# Patient Record
Sex: Male | Born: 1940 | Race: Black or African American | Hispanic: No | Marital: Single
Health system: Southern US, Community
[De-identification: ages and names within clinical notes are randomized; demographics above are authoritative.]

---

## 2004-09-04 ENCOUNTER — Emergency Department: Payer: Self-pay | Admitting: Emergency Medicine

## 2006-11-29 ENCOUNTER — Encounter: Payer: Self-pay | Admitting: Internal Medicine

## 2006-12-15 ENCOUNTER — Encounter: Payer: Self-pay | Admitting: Internal Medicine

## 2007-01-15 ENCOUNTER — Encounter: Payer: Self-pay | Admitting: Internal Medicine

## 2007-10-28 ENCOUNTER — Inpatient Hospital Stay: Payer: Self-pay | Admitting: Internal Medicine

## 2007-10-29 ENCOUNTER — Other Ambulatory Visit: Payer: Self-pay

## 2010-03-16 ENCOUNTER — Ambulatory Visit: Payer: Self-pay | Admitting: Internal Medicine

## 2010-04-02 ENCOUNTER — Inpatient Hospital Stay: Payer: Self-pay | Admitting: Internal Medicine

## 2010-04-14 ENCOUNTER — Emergency Department: Payer: Self-pay | Admitting: Emergency Medicine

## 2010-04-16 ENCOUNTER — Ambulatory Visit: Payer: Self-pay | Admitting: Internal Medicine

## 2010-04-18 ENCOUNTER — Ambulatory Visit: Payer: Self-pay | Admitting: Geriatric Medicine

## 2010-09-24 ENCOUNTER — Ambulatory Visit: Payer: Self-pay | Admitting: Vascular Surgery

## 2011-12-15 ENCOUNTER — Ambulatory Visit: Payer: Self-pay | Admitting: Physician Assistant

## 2012-09-16 ENCOUNTER — Inpatient Hospital Stay: Payer: Self-pay | Admitting: Internal Medicine

## 2012-09-16 ENCOUNTER — Ambulatory Visit: Payer: Self-pay | Admitting: Internal Medicine

## 2012-09-16 LAB — URINALYSIS, COMPLETE
Glucose,UR: NEGATIVE mg/dL (ref 0–75)
Nitrite: NEGATIVE
Protein: 30
WBC UR: 26 /HPF (ref 0–5)

## 2012-09-16 LAB — COMPREHENSIVE METABOLIC PANEL
Alkaline Phosphatase: 134 U/L (ref 50–136)
Anion Gap: 14 (ref 7–16)
Co2: 22 mmol/L (ref 21–32)
Creatinine: 1.9 mg/dL — ABNORMAL HIGH (ref 0.60–1.30)
EGFR (African American): 40 — ABNORMAL LOW
EGFR (Non-African Amer.): 35 — ABNORMAL LOW
Glucose: 173 mg/dL — ABNORMAL HIGH (ref 65–99)
Osmolality: 295 (ref 275–301)
SGOT(AST): 28 U/L (ref 15–37)
Total Protein: 8.7 g/dL — ABNORMAL HIGH (ref 6.4–8.2)

## 2012-09-16 LAB — BASIC METABOLIC PANEL
BUN: 47 mg/dL — ABNORMAL HIGH (ref 7–18)
Chloride: 118 mmol/L — ABNORMAL HIGH (ref 98–107)
EGFR (Non-African Amer.): 27 — ABNORMAL LOW
Osmolality: 310 (ref 275–301)
Sodium: 150 mmol/L — ABNORMAL HIGH (ref 136–145)

## 2012-09-16 LAB — CBC WITH DIFFERENTIAL/PLATELET
Basophil #: 0 10*3/uL (ref 0.0–0.1)
HGB: 14.7 g/dL (ref 13.0–18.0)
Lymphocyte %: 15.3 %
MCV: 91 fL (ref 80–100)
Monocyte #: 0.3 x10 3/mm (ref 0.2–1.0)
Neutrophil #: 1.5 10*3/uL (ref 1.4–6.5)
Neutrophil %: 69.3 %
Platelet: 197 10*3/uL (ref 150–440)
RBC: 4.97 10*6/uL (ref 4.40–5.90)
WBC: 2.2 10*3/uL — ABNORMAL LOW (ref 3.8–10.6)

## 2012-09-16 LAB — CBC: MCH: 29.5 pg (ref 26.0–34.0)

## 2012-09-17 LAB — PROTIME-INR: Prothrombin Time: 18.1 secs — ABNORMAL HIGH (ref 11.5–14.7)

## 2012-09-17 LAB — COMPREHENSIVE METABOLIC PANEL
Albumin: 1.8 g/dL — ABNORMAL LOW (ref 3.4–5.0)
BUN: 34 mg/dL — ABNORMAL HIGH (ref 7–18)
Calcium, Total: 7.6 mg/dL — ABNORMAL LOW (ref 8.5–10.1)
Chloride: 122 mmol/L — ABNORMAL HIGH (ref 98–107)
Creatinine: 2.19 mg/dL — ABNORMAL HIGH (ref 0.60–1.30)
EGFR (African American): 34 — ABNORMAL LOW
Osmolality: 312 (ref 275–301)
SGOT(AST): 22 U/L (ref 15–37)
SGPT (ALT): 20 U/L (ref 12–78)

## 2012-09-17 LAB — CBC WITH DIFFERENTIAL/PLATELET
Basophil %: 0.2 %
Eosinophil #: 0 10*3/uL (ref 0.0–0.7)
HCT: 32.7 % — ABNORMAL LOW (ref 40.0–52.0)
HGB: 10.8 g/dL — ABNORMAL LOW (ref 13.0–18.0)
Lymphocyte #: 0.5 10*3/uL — ABNORMAL LOW (ref 1.0–3.6)
Lymphocyte %: 5.9 %
MCHC: 33 g/dL (ref 32.0–36.0)
MCV: 90 fL (ref 80–100)
Neutrophil #: 8 10*3/uL — ABNORMAL HIGH (ref 1.4–6.5)
Neutrophil %: 88.7 %
WBC: 9.1 10*3/uL (ref 3.8–10.6)

## 2012-09-18 LAB — URINE CULTURE

## 2012-09-18 LAB — BASIC METABOLIC PANEL
BUN: 24 mg/dL — ABNORMAL HIGH (ref 7–18)
Calcium, Total: 8.2 mg/dL — ABNORMAL LOW (ref 8.5–10.1)
Chloride: 120 mmol/L — ABNORMAL HIGH (ref 98–107)
Co2: 22 mmol/L (ref 21–32)
EGFR (Non-African Amer.): 36 — ABNORMAL LOW
Glucose: 79 mg/dL (ref 65–99)
Osmolality: 303 (ref 275–301)
Potassium: 3.5 mmol/L (ref 3.5–5.1)

## 2012-09-18 LAB — CBC WITH DIFFERENTIAL/PLATELET
Basophil #: 0 10*3/uL (ref 0.0–0.1)
Basophil %: 0.1 %
HGB: 10 g/dL — ABNORMAL LOW (ref 13.0–18.0)
Lymphocyte %: 5.9 %
MCH: 30 pg (ref 26.0–34.0)
MCHC: 33.4 g/dL (ref 32.0–36.0)
MCV: 90 fL (ref 80–100)
Monocyte #: 0.3 x10 3/mm (ref 0.2–1.0)
Neutrophil %: 90.6 %
RBC: 3.34 10*6/uL — ABNORMAL LOW (ref 4.40–5.90)
RDW: 16 % — ABNORMAL HIGH (ref 11.5–14.5)

## 2012-09-19 LAB — CBC WITH DIFFERENTIAL/PLATELET
Basophil #: 0 10*3/uL (ref 0.0–0.1)
Basophil %: 0.1 %
Eosinophil #: 0 10*3/uL (ref 0.0–0.7)
MCH: 29.8 pg (ref 26.0–34.0)
MCHC: 33.7 g/dL (ref 32.0–36.0)
MCV: 88 fL (ref 80–100)
Monocyte #: 0.3 x10 3/mm (ref 0.2–1.0)
Neutrophil #: 8 10*3/uL — ABNORMAL HIGH (ref 1.4–6.5)
Neutrophil %: 88.2 %
Platelet: 157 10*3/uL (ref 150–440)
RBC: 3.19 10*6/uL — ABNORMAL LOW (ref 4.40–5.90)
RDW: 16.3 % — ABNORMAL HIGH (ref 11.5–14.5)
WBC: 9 10*3/uL (ref 3.8–10.6)

## 2012-09-19 LAB — BASIC METABOLIC PANEL
Anion Gap: 7 (ref 7–16)
Calcium, Total: 8.1 mg/dL — ABNORMAL LOW (ref 8.5–10.1)
Chloride: 115 mmol/L — ABNORMAL HIGH (ref 98–107)
Co2: 23 mmol/L (ref 21–32)
Creatinine: 1.77 mg/dL — ABNORMAL HIGH (ref 0.60–1.30)
EGFR (African American): 44 — ABNORMAL LOW
EGFR (Non-African Amer.): 38 — ABNORMAL LOW
Glucose: 80 mg/dL (ref 65–99)
Osmolality: 289 (ref 275–301)

## 2012-09-20 LAB — BASIC METABOLIC PANEL
Anion Gap: 6 — ABNORMAL LOW (ref 7–16)
BUN: 10 mg/dL (ref 7–18)
Chloride: 116 mmol/L — ABNORMAL HIGH (ref 98–107)
EGFR (African American): 47 — ABNORMAL LOW
EGFR (Non-African Amer.): 41 — ABNORMAL LOW
Osmolality: 291 (ref 275–301)
Potassium: 3.6 mmol/L (ref 3.5–5.1)
Sodium: 147 mmol/L — ABNORMAL HIGH (ref 136–145)

## 2012-09-20 LAB — HEMOGLOBIN: HGB: 9.1 g/dL — ABNORMAL LOW (ref 13.0–18.0)

## 2012-09-21 LAB — BASIC METABOLIC PANEL
Anion Gap: 9 (ref 7–16)
BUN: 9 mg/dL (ref 7–18)
Co2: 23 mmol/L (ref 21–32)
EGFR (Non-African Amer.): 44 — ABNORMAL LOW
Glucose: 78 mg/dL (ref 65–99)
Potassium: 3.4 mmol/L — ABNORMAL LOW (ref 3.5–5.1)

## 2012-09-21 LAB — CULTURE, BLOOD (SINGLE)

## 2012-10-14 ENCOUNTER — Ambulatory Visit: Payer: Self-pay | Admitting: Internal Medicine

## 2013-11-20 IMAGING — CT CT ABD-PELV W/O CM
1 of 3 series · 13 of 32 positions shown, 18 images · non-contrast
Comparison: 04/15/2010

REASON FOR EXAM: (1) llq abd pain and vomiting eval; (2) llq abd pain and
vomiting eval;    NOTE:
COMMENTS:

PROCEDURE:     CT  - CT ABDOMEN AND PELVIS W[DATE]  [DATE]
RESULT:     Indication: Abdominal pain
TECHNIQUE: Multiple axial images from the lung bases to the symphysis pubis
were obtained without oral and without intravenous contrast.

[Series 2: 3mm soft tissue · axial · 0.75mm/px · z∈[-951,-462]mm · 13 of 185 slices shown, 18 images]
[im 11/185  soft-tissue]
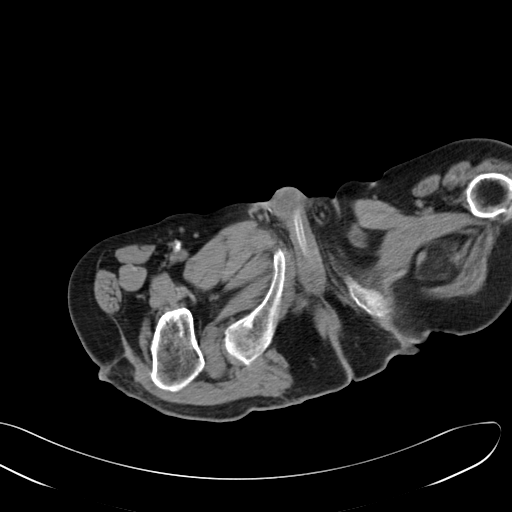
[im 11/185  bone]
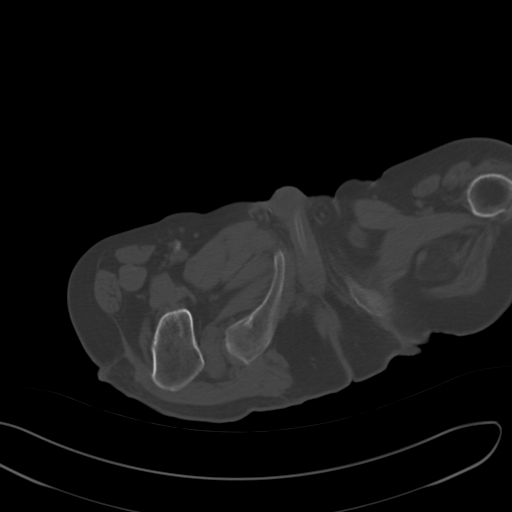
[im 31/185  soft-tissue]
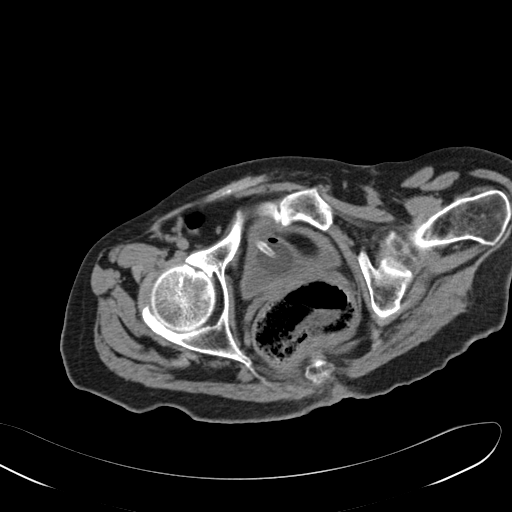
[im 41/185  soft-tissue]
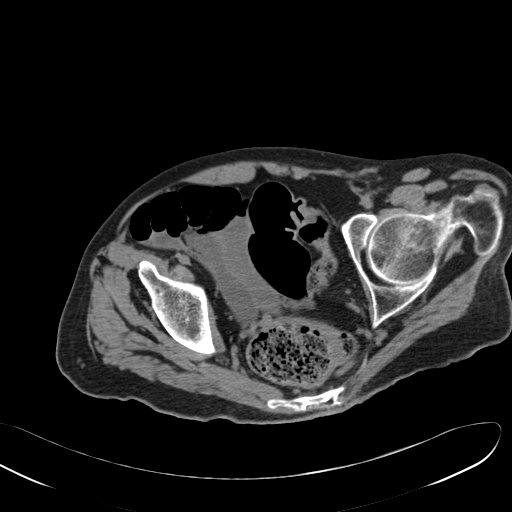
[im 52/185  soft-tissue]
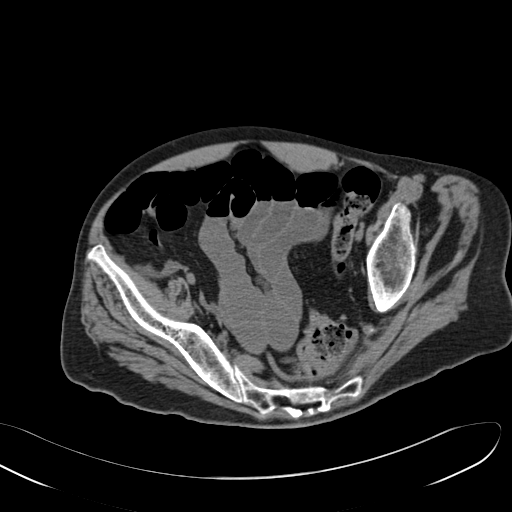
[im 72/185  soft-tissue]
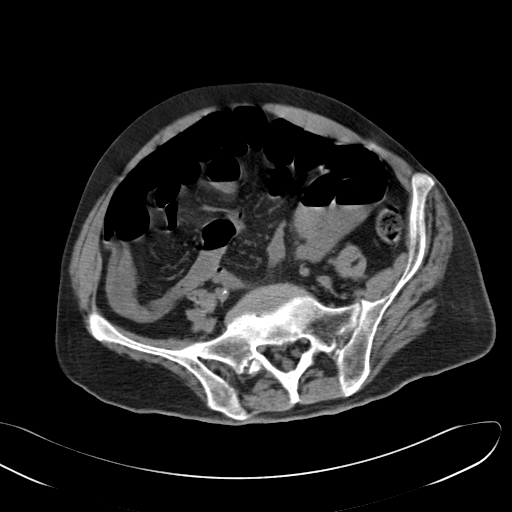
[im 82/185  soft-tissue]
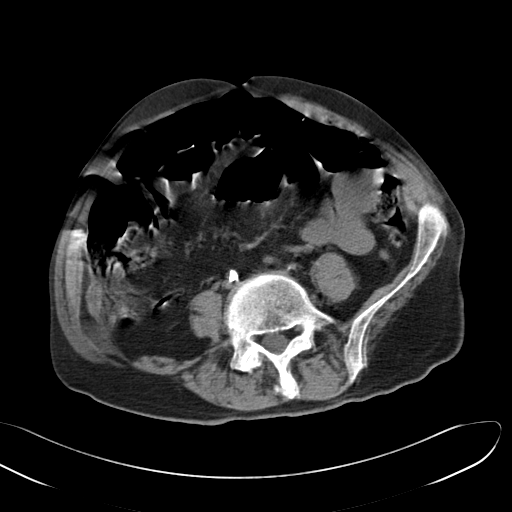
[im 103/185  soft-tissue]
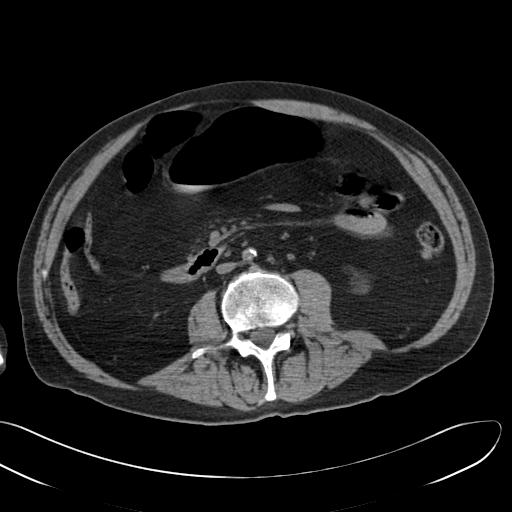
[im 113/185  soft-tissue]
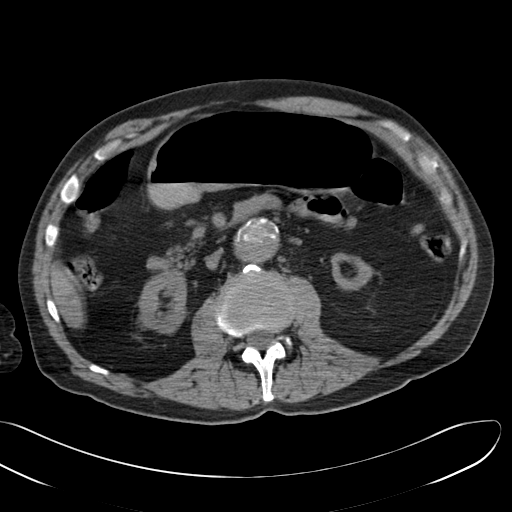
[im 133/185  soft-tissue]
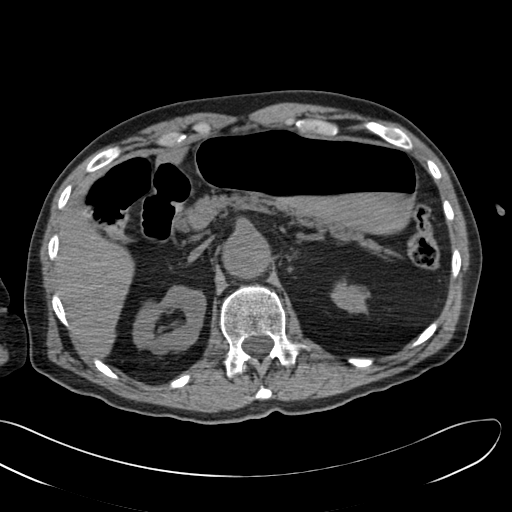
[im 133/185  bone]
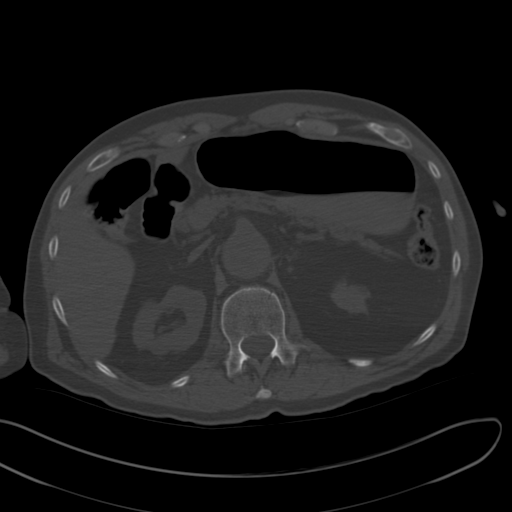
[im 144/185  soft-tissue]
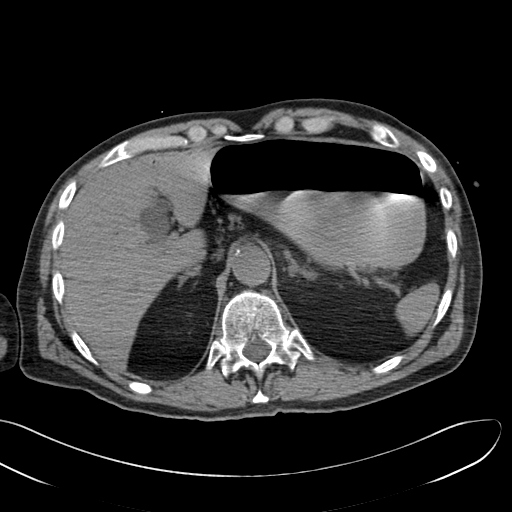
[im 144/185  lung]
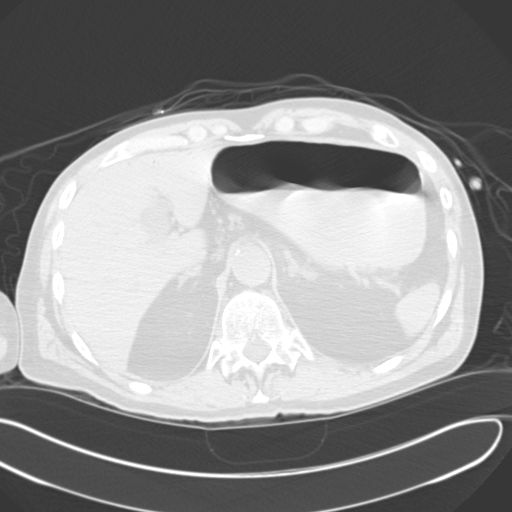
[im 154/185  soft-tissue]
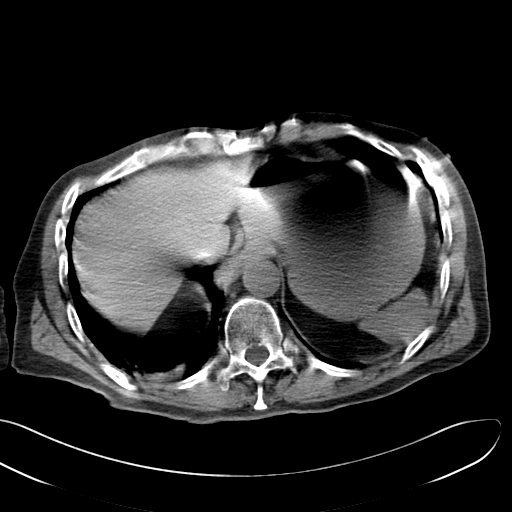
[im 154/185  lung]
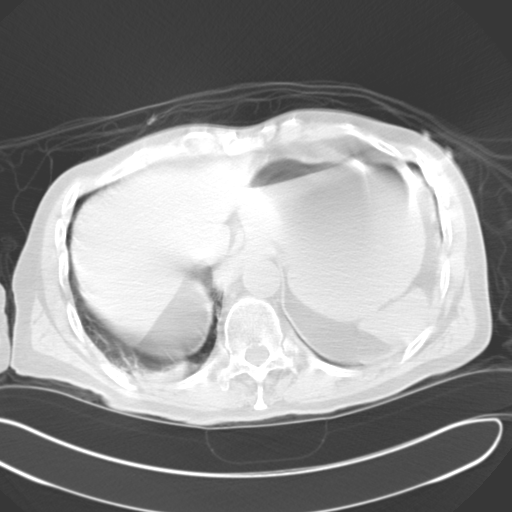
[im 164/185  lung]
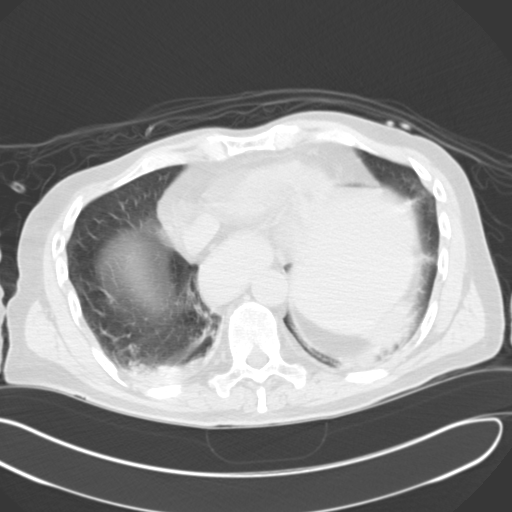
[im 174/185  soft-tissue]
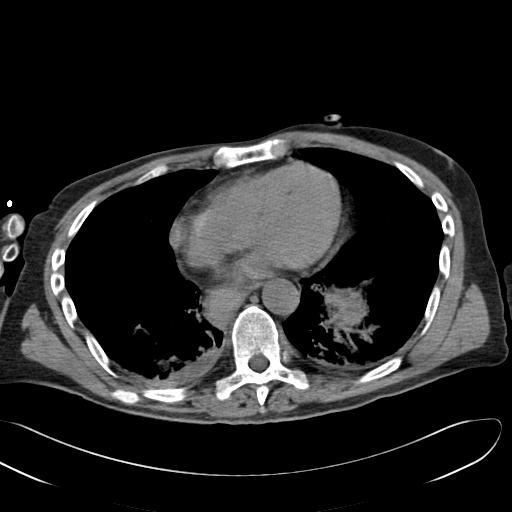
[im 174/185  lung]
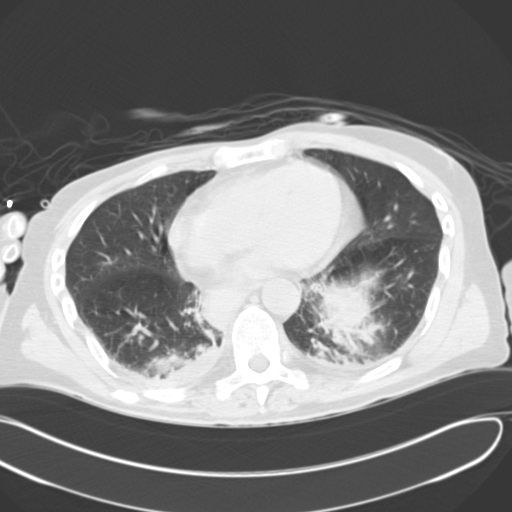

[13 of 32 positions shown; findings below may reference images not displayed]

FINDINGS: There is significant patient motion degrading image quality limiting
evaluation.

There is bibasilar airspace disease which may represent atelectasis versus
pneumonia.

No renal, ureteral, or bladder calculi. No obstructive uropathy. No
perinephric stranding is seen. The left kidney is atrophic. The right kidney
is normal in size. The bladder is unremarkable.

The liver demonstrates no focal abnormality. The gallbladder is surgically
absent. The spleen demonstrates no focal abnormality. The adrenal glands and
pancreas are normal.

There is a gastric air-fluid level and numerous small bowel air-fluid levels
with a relatively decompressed distal ileum and colon.  There is no definite
transition point. There is a small amount of air within the liver
peripherally which is indeterminate for pneumobilia versus portal venous
gas. There is no pneumatosis or pneumoperitoneum. There is no abdominal or
pelvic free fluid. There is no lymphadenopathy.

There is a infrarenal abdominal aortic aneurysm measuring 5.5 x 4.6 cm.

The osseous structures are unremarkable.
IMPRESSION: 1. There is a gastric air-fluid level and numerous small bowel air-fluid
levels with a relatively decompressed distal ileum and colon. The appearance
is concerning for small bowel obstruction versus sequela gastroenteritis.
There is indeterminate air peripherally within the liver which may represent
pneumobilia impression portal venous gas as can be seen in the setting of
ischemic bowel.

2. Infrarenal abdominal aortic aneurysm measuring 5.5 x 4.6 cm similar in
appearance to 04/15/2010.

3. Bibasilar airspace disease concerning for atelectasis versus pneumonia
versus aspiration pneumonia.

[REDACTED]

## 2014-01-13 ENCOUNTER — Other Ambulatory Visit: Payer: Self-pay

## 2014-01-13 LAB — CBC WITH DIFFERENTIAL/PLATELET
BASOS ABS: 0.1 10*3/uL (ref 0.0–0.1)
BASOS PCT: 1.2 %
EOS ABS: 0.2 10*3/uL (ref 0.0–0.7)
EOS PCT: 3.4 %
HCT: 38.1 % — ABNORMAL LOW (ref 40.0–52.0)
HGB: 12.3 g/dL — AB (ref 13.0–18.0)
LYMPHS PCT: 36.4 %
Lymphocyte #: 1.9 10*3/uL (ref 1.0–3.6)
MCH: 28.9 pg (ref 26.0–34.0)
MCHC: 32.2 g/dL (ref 32.0–36.0)
MCV: 90 fL (ref 80–100)
MONOS PCT: 9.6 %
Monocyte #: 0.5 x10 3/mm (ref 0.2–1.0)
Neutrophil #: 2.5 10*3/uL (ref 1.4–6.5)
Neutrophil %: 49.4 %
Platelet: 351 10*3/uL (ref 150–440)
RBC: 4.25 10*6/uL — AB (ref 4.40–5.90)
RDW: 17.6 % — ABNORMAL HIGH (ref 11.5–14.5)
WBC: 5.2 10*3/uL (ref 3.8–10.6)

## 2014-12-06 NOTE — H&P (Signed)
PATIENT NAME:  Jon Silva, Jon Silva MR#:  [id number] DATE OF BIRTH:  [birth year]  DATE OF ADMISSION:  09/16/2012  REFERRING PHYSICIAN: Rebecka Apley, MD   PRIMARY CARE PHYSICIAN: Glenetta Borg, MD  CHIEF COMPLAINT: Coffee-ground emesis.   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 The patient is lethargic, cannot give any reliable history, unclear what is his baseline. Most of the history is obtained from ED staff and documentation and nursing home documentation and family over the phone. The patient had a few episodes of coffee-ground emesis at the nursing home, which prompted them to bring him to ED. There is no report of fever, chills or diarrhea. As well in the ED, the patient had a few episodes of coffee-ground emesis. Had NG tube inserted where he had 500 mL of dark material suctioned. As well had another large episode of coffee-ground emesis in ED. The patient's hemoglobin was stable at 16.3, which is above his baseline. Blood pressure was on the lower side as well, which improved with IV fluid boluses. The patient was febrile. His creatinine was noticed to be worsened with baseline 1.5 to 1.9. As well the patient's urine was positive for leukocyte esterase, bacteria +3 and 26 white blood cells in the urine. The patient was noticed to have significant wheezing, shortness of breath, tachypneic, tachycardic, requiring nonrebreather mask due to hypoxia. As well the patient was noticed to have abdominal distention, where he had CT abdomen and pelvis. CT of abdomen and pelvis did show a distended stomach, with bibasilar opacity due to aspiration in the lung and multiple fluid-filled mildly  dilated loops suggesting gastroenteritis versus ischemic bowel. As well the CT abdomen did show old abdominal aortic aneurysm with distal occlusion. As well there was some hepatic parenchymal air, which was indeterminate exact location and etiology. The patient had NG tube inserted and started on intermittent suctioning, was started on IV vancomycin and Zosyn for aspiration pneumonia, had type and screen done. Admission was requested by hospitalist to admit for further management of this coffee-ground emesis and other issues. The patient's next of kin, his sister, was contacted (Ms. Habeeb Puertas), which she confirmed DNR/DNI code status, where the patient had yellow Do Not Resuscitate form presented with him from the nursing home.   PAST MEDICAL HISTORY:  1.  History of COPD.  2.  History of sinus bradycardia.  3.  Hyperlipidemia.  4.  Severe peripheral vascular disease, status post left above-knee amputation.  5.  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  6.  Hypertension.  7.  History of upper GI bleed thought to be related to gastric angiectasias,  8.  Gastroesophageal reflux disease.  9.  Chronic kidney disease, baseline creatinine around 1.8.  10.  Paranoid psychosis.   PAST SURGICAL HISTORY: Left above-knee amputation.   ALLERGIES: No known drug allergies.   HOME MEDICATIONS:  1.  Aspirin 81 mg daily. 2.  Tylenol 2 tablets every 4 hours as needed.  3.  Lisinopril 20 mg oral daily. 4.  Promethazine 25 mg oral every 6 hours as needed.  5.  Risperdal 1 mg daily at bedtime.  6.  Colace 100 mg oral 2 times a day.  7.  Omeprazole 20  mg oral 2 times a day.  8.  Cerovite Advanced Formula 1 tablet oral daily.  9.  Vitamin D3, 50,000 international units oral daily.   REVIEW OF SYSTEMS: Unable to evaluate secondary to the patient's expressive aphasia and altered mental status and lethargy.   SOCIAL HISTORY: The patient is a nursing home resident. No history of drug  or alcohol use in the past. Unclear if he is still actively smoking or not.   FAMILY HISTORY: Unable to be obtained.   PHYSICAL EXAMINATION:  VITAL SIGNS: Temperature 98.6, pulse 150, respiratory rate 24, blood pressure 109/78, saturating 90% on nonrebreather mask.  GENERAL: Ill-appearing, malnourished male who lies in bed in mild respiratory distress.  HEENT: Head atraumatic, normocephalic. Pink conjunctivae. Dry oral mucosa. Has nonrebreather mask. NECK: Supple. No thyromegaly. No JVD.  CHEST: Had scattered wheezing and bibasilar rales with decreased air entry.  CARDIOVASCULAR: S1, S2 heard. No rubs, murmurs, gallops. Tachycardic.  ABDOMEN: Mildly distended. Could not elicit any tenderness or rebound or guarding on physical exam. No hepatosplenomegaly.  EXTREMITIES: Left above-knee amputation. Has decreased pulses in the right lower extremity.  MUSCULOSKELETAL: Has significant muscle wasting and temporal wasting as well, with no significant joint effusions.  SKIN: Normal skin turgor. Warm and dry. Had healed sacral decubitus ulcer with some erythema only with no skin ulcerations.  PSYCHIATRIC: Unable to evaluate secondary to lethargy. At some point tried to verbalize incoherent words, but no attempts to communicate.  NEUROLOGIC: Unable to evaluate secondary to mental status, but appears to be contracted on the right side with no movement. Has some muscle movement in his left upper extremity and left stump.   LABORATORY AND RADIOLOGICAL DATA: Glucose 173, BUN 35, creatinine 1.9, sodium 142, potassium 4, chloride 106, CO2 of 22, total protein 8.7, albumin 3.1, alkaline phosphatase 134, AST 28, ALT 34. White blood cells 9.2, hemoglobin 16.3, hematocrit 42.9, platelets 303. Urinalysis showing +2 leukocyte esterase, 26 white blood cells and +3 bacteria.   CT of abdomen and pelvis showing marked gastric distention with gastroesophageal reflux and bibasilar opacities, which are likely due to  aspiration; multiple fluid-filled mildly dilated loops of bowel are noted in the pelvis suggesting gastroenteritis or possibly ischemic bowel in the setting of atherosclerosis and abdominal aortic aneurysm with distal occlusion and small amount of peripheral hepatitic parenchymal air, which is indeterminate in its exact location and etiology; unstable large abdominal aortic aneurysm with distal occlusion demonstrated on the previous CT angiogram.   ASSESSMENT AND PLAN: This is a 74 year old male from skilled nursing facility who presents with coffee-ground emesis (multiple episodes), as well hypotensive, tachycardic, hypoxic, with a stable hemoglobin of 16.3, worsening renal function, appears to be having coffee-ground emesis as well as possible small bowel obstruction versus ischemic colitis and aspiration pneumonia.  1.  Acute respiratory failure: This is most likely related to aspiration pneumonia, as the patient has bibasilar opacities on the CT. Will start on vancomycin and Zosyn. To a lesser degree, chronic obstructive pulmonary disease might be contributing to this, as he has wheezing as well, so we will have him on nebulizer treatment and will give him 1 dose of Solu-Medrol.  2.  Aspiration pneumonia: We will continue on vancomycin and Zosyn. Will keep nil per os. Will send blood cultures.  3.  Coffee-ground emesis/hematemesis: The patient will be started on Protonix drip. Will hold his aspirin. Will consult gastroenterology. Will keep active type and screen. I will check hemoglobin and hematocrit every 6 hours. He will be  admitted to Critical Care Unit.  4.  Vomiting with abdominal distention: CT showing small bowel obstruction versus gastroenteritis versus ischemic bowel disease. Will check lactic acid and consult surgery. The patient has nasogastric tube inserted. Will keep him on intermittent suction. Will await surgery evaluation.  5.  Urinary tract infection: Continue on antibiotics.  6.  Acute  on chronic renal failure: Will hold lisinopril and will continue with fluids  7.  Hypertension: Hold all medications, as the patient is hypotensive.  8.  Deep vein thrombosis prophylaxis: Will hold on chemical anticoagulation secondary to coffee-ground emesis.  9.  Gastrointestinal prophylaxis: The patient is on Protonix drip.  10.  Code status: The patient had yellow form (DO NOT RESUSCITATE/DO NOT INTUBATE form) from the nursing home. Spoke with the patient's sister over the phone who wishes to continue DO NOT RESUSCITATE/DO NOT INTUBATE form but wishes to continue with all other measures. Will have palliative care consulted as well.   TOTAL TIME SPENT ON ADMISSION AND PATIENT CARE: 65 minutes.   ____________________________ Starleen Armsawood S. Gino Garrabrant, MD dse:jm D: 09/16/2012 06:51:13 ET T: 09/16/2012 15:36:30 ET JOB#: [id number]  cc: Starleen Armsawood S. Shanna Un, MD, <Dictator> Torrez Renfroe Teena IraniS Malyn Aytes MD ELECTRONICALLY SIGNED 09/18/2012 2:15

## 2014-12-06 NOTE — Consult Note (Signed)
PATIENT NAME:  Jon Silva, Jon Silva MR#:  [id number] DATE OF BIRTH:  05-30-1941  DATE OF CONSULTATION:  09/16/2012  REFERRING PHYSICIAN:  Huey Bienenstock, MD  CONSULTING PHYSICIAN:  Christena Deem, MD  REASON FOR CONSULTATION: Hematemesis.   HISTORY OF PRESENT ILLNESS: The patient is a 74 year old Caucasian male who was brought to the Emergency Room this morning after apparently several episodes of coffee-ground emesis in his nursing home. There was apparently no problem with fever, chills, or diarrhea. The patient is unable to give me history himself as he has a severe expressive aphasia. There are no family members in the room. Much of this history is obtained from the admitting physician's history and physical. In the Emergency Room, he also had an episode of dark coffee-ground emesis. An NG tube was placed at that time with dark material being suctioned. His hemoglobin, however, was 16.3 on admission. He did, however, show a marked tachycardia as well as hypotension. He has been receiving IV fluid boluses as well as being on pressor agent in the  CCU. There was apparently some concern when he came in that there could possibly be ischemic bowel and a CT was obtained. He has been stable otherwise, appears to be comfortable in the CCU. I believe that he denies problems with abdominal pain. He does relate having some episodes of emesis in the nursing home.   PAST MEDICAL HISTORY: COPD, sinus bradycardia, hyperlipidemia, severe peripheral vascular disease with a left AKA. A history of CVA with expressive aphasia and right-sided hemiparesis with contractions. Hypertension. A history of GI bleed in the past. He had an EGD by this physician on 10/30/2007 for hematemesis showing gastric and duodenal angiectasias which were nonbleeding at the time. At that time, he was also admitted for C. difficile colitis. A history of gastroesophageal reflux for which he is on a proton pump inhibitor. A history of CKA as  well as paranoid psychosis. He underwent a cholecystectomy with common bile duct exploration and T-tube placement for emphysematous cholangitis in 03/2010.  OUTPATIENT MEDICATIONS INCLUDE: Aspirin 81 mg once a day, CertaVite formula once a day, Colace 200 mg twice a day, lisinopril 2.5 mg once a day, omeprazole 20 mg twice a day, promethazine 25 mg every 6 hours p.r.n., Risperdal 1 mg oral tablet once a day, Tylenol 325 mg 2 tablets every 4 hours p.r.n., and vitamin D3.   ALLERGIES: No known drug allergies.   FAMILY HISTORY: Unable to illicit.   SOCIAL HISTORY: He lives in a nursing home, unable to elicit further detail.   REVIEW OF SYSTEMS: Unable to elicit detail. The patient has expressive aphasia.   PHYSICAL EXAMINATION:  VITAL SIGNS: Temperature was 98, pulse 154 to 130, blood pressure 88/55 to 79/46, pulse ox 94% to 100% on a nonrebreather mask. He is on Levophed as well as IV Protonix. There have been no repeat episodes of hematemesis since he got to the CCU.  GENERAL: He is a cachectic-appearing, 74 year old [place] American male. It is very difficult to elicit replies due to his severe expressive aphasia. He does not appear to be uncomfortable or in distress.  HEENT: Normocephalic, atraumatic. Eyes are anicteric. Oropharynx: There is some old dried blood in the oropharynx, which he leaves open.  NECK: No JVD.  HEART: Tachycardia.  LUNGS: Clear.  ABDOMEN: Soft, nontender, nondistended. Bowel sounds positive, normoactive.  EXTREMITIES: There is a left AKA. There is no edema in the right extremity. It is contracted.  RECTAL: Anorectal exam deferred.  LABORATORY, DIAGNOSTIC, AND RADIOLOGICAL DATA: On coming to the hospital late last night, he had the following laboratories: He had a glucose of 173, BUN 35, creatinine 1.90, sodium 142, potassium 4.0, chloride 106, bicarb 22, lipase 80. His hepatic profile showed a total protein of 8.7, albumin 3.1, otherwise normal. His hemogram showed a  white count of 9.2, hemoglobin and hematocrit 16.3/49.2, platelet count of 303. He has had 3 hemoglobins drawn since last night, these being 15.3, 14.7 and currently 12.0, respectively. There has been again no repeat bleeding since coming to the CCU. His repeat laboratories showed a creatinine of 2.33, BUN of 47. He had a cardiopulmonary lactic acid of 7.3 early this morning, now 3.5. He had a portable chest film showing no acute disease. He had a CT scan of the abdomen and pelvis without contrast. This showing a gastric air-fluid level, numerous small bowel air-fluid levels, relatively decompressed distal ileum and colon, concern for possible small bowel obstruction versus sequela of gastroenteritis, possible air peripherally in the liver representing pneumobilia.  Impression: Portal venous gas can be seen in the setting of ischemic bowel. There is an infrarenal abdominal aortic aneurysm measuring 5.5 x 4.6 cm and bilateral airspace disease reflecting atelectasis versus pneumonia versus aspiration pneumonia.    ASSESSMENT: Hematemesis in the setting of prior history of gastric and duodenal AVMs. Typically, hematemesis from these types of lesions is small volume with small drops of hemoglobin. There has been no reported melena. Hemodynamically, he has had hypotension since arrival as well as tachycardia. He is currently on a pressor medication.   RECOMMENDATION: Continue IV PPI Protonix drip as you are. Serial hemoglobins. Transfuse as needed. It is of note that his platelet count is normal. Evaluation by Surgery in regards to a concern for possible ischemic bowel is pending. However, clinically, he does not reflect this. Currently the patient is not a candidate for sedated luminal evaluation. We will follow clinically for now.  ____________________________ Christena DeemMartin U. Linton Stolp, MD mus:jm D: 09/16/2012 17:23:34 ET T: 09/16/2012 17:51:16 ET JOB#: [id number]  cc: Christena DeemMartin U. Stryder Poitra, MD, <Dictator> Christena DeemMARTIN U  Braxten Memmer MD ELECTRONICALLY SIGNED 09/22/2012 6:15

## 2014-12-06 NOTE — Consult Note (Signed)
Brief Consult Note: Diagnosis: hematemesis.   Patient was seen by consultant.   Recommend further assessment or treatment.   Discussed with Attending MD.   Comments: Please see full GI consult [phone]#[id number].  Patietn admitted with several episodes of coffeeground emesis.  Known h/o gastric and duodenal avms.  Currently on iv pressor for hypotension, no repeat emesis since coming to the ICU.  Currently not a candidate for sedated luminal evaluation.  Continue treatment with iv ppi drip.   Will follow clinically.  Electronic Signatures: Barnetta ChapelSkulskie, Gao Mitnick (MD)  (Signed 01-Feb-14 17:25)  Authored: Brief Consult Note   Last Updated: 01-Feb-14 17:25 by Barnetta ChapelSkulskie, Josefina Rynders (MD)

## 2014-12-06 NOTE — Consult Note (Signed)
Chief Complaint:   Subjective/Chief Complaint seen for hematemesis.  No recurrent emesis, denies abdominal apin.  Toleratient clears.  Had bm, no evidence of melena.   VITAL SIGNS/ANCILLARY NOTES: **Vital Signs.:   02-Feb-14 08:00   Vital Signs Type Routine   Temperature Temperature (F) 98   Celsius 36.6   Temperature Source oral   Pulse Pulse 112   Respirations Respirations 34   Systolic BP Systolic BP 979   Diastolic BP (mmHg) Diastolic BP (mmHg) 59   Mean BP 76   BP Source  if not from Vital Sign Device non-invasive   Pulse Ox % Pulse Ox % 95   Pulse Ox Activity Level  At rest   Oxygen Delivery 6L; Nasal Cannula   Pulse Ox Heart Rate 110   Pulse Ox After Adjustment (RN or RCP Only) % 100   Oxygen Delivery Adjusted To (RN or RCP Only)  4L; Nasal Cannula   Central Line Location right internal jugular vein    12:00   Vital Signs Type Routine   Pulse Pulse 90   Pulse source if not from Vital Sign Device per cardiac monitor   Respirations Respirations 16   Systolic BP Systolic BP 95   Diastolic BP (mmHg) Diastolic BP (mmHg) 49   Mean BP 64   BP Source  if not from Vital Sign Device non-invasive   Pulse Ox % Pulse Ox % 92   Pulse Ox Activity Level  At rest   Oxygen Delivery Room Air/ 21 %   Pulse Ox Heart Rate 90   Brief Assessment:   Cardiac Regular    Respiratory clear BS    Gastrointestinal details normal Soft  Nontender  Nondistended  No masses palpable  Bowel sounds normal   Lab Results: Hepatic:  02-Feb-14 02:34    Bilirubin, Total 0.3   Alkaline Phosphatase 66   SGPT (ALT) 20   SGOT (AST) 22   Total Protein, Serum  5.5   Albumin, Serum  1.8  Routine Chem:  02-Feb-14 02:34    Result Comment LABS - This specimen was collected through an   - indwelling catheter or arterial line.  - A minimum of 47ms of blood was wasted prior    - to collecting the sample.  Interpret  - results with caution.  Result(s) reported on 17 Sep 2012 at 02:59AM.   Glucose, Serum   109   BUN  34   Creatinine (comp)  2.19   Sodium, Serum  153   Potassium, Serum 3.9   Chloride, Serum  122   CO2, Serum  19   Calcium (Total), Serum  7.6   Osmolality (calc) 312   eGFR (African American)  34   eGFR (Non-African American)  29 (eGFR values <653mmin/1.73 m2 may be an indication of chronic kidney disease (CKD). Calculated eGFR is useful in patients with stable renal function. The eGFR calculation will not be reliable in acutely ill patients when serum creatinine is changing rapidly. It is not useful in  patients on dialysis. The eGFR calculation may not be applicable to patients at the low and high extremes of body sizes, pregnant women, and vegetarians.)   Anion Gap 12  Routine Coag:  02-Feb-14 02:34    Prothrombin  18.1   INR 1.5 (INR reference interval applies to patients on anticoagulant therapy. A single INR therapeutic range for coumarins is not optimal for all indications; however, the suggested range for most indications is 2.0 - 3.0. Exceptions to the INR  Reference Range may include: Prosthetic heart valves, acute myocardial infarction, prevention of myocardial infarction, and combinations of aspirin and anticoagulant. The need for a higher or lower target INR must be assessed individually. Reference: The Pharmacology and Management of the Vitamin K  antagonists: the seventh ACCP Conference on Antithrombotic and Thrombolytic Therapy. RJPVG.6815 Sept:126 (3suppl): N9146842. A HCT value >55% may artifactually increase the PT.  In one study,  the increase was an average of 25%. Reference:  "Effect on Routine and Special Coagulation Testing Values of Citrate Anticoagulant Adjustment in Patients with High HCT Values." American Journal of Clinical Pathology 2006;126:400-405.)  Routine Hem:  02-Feb-14 02:34    WBC (CBC) 9.1   RBC (CBC)  3.63   Hemoglobin (CBC)  10.8   Hematocrit (CBC)  32.7   Platelet Count (CBC) 160   MCV 90   MCH 29.8   MCHC 33.0    RDW  15.6   Neutrophil % 88.7   Lymphocyte % 5.9   Monocyte % 5.2   Eosinophil % 0.0   Basophil % 0.2   Neutrophil #  8.0   Lymphocyte #  0.5   Monocyte # 0.5   Eosinophil # 0.0   Basophil # 0.0   Assessment/Plan:  Assessment/Plan:   Assessment 1) hematemesis-not recurrent since admission.  no evidence of melena.  Patient on ppi drip currently.  Patient with known h/o gastric and duodenal avms from [facility] several years ago.    Plan 1) patient not a good cancidate for sedated proceedure, responding ot emperic tx.  Will change ppi to iv bid. continue clears, advance to full liquids tomorrow. following.   Electronic Signatures: Loistine Simas (MD)  (Signed 02-Feb-14 16:22)  Authored: Chief Complaint, VITAL SIGNS/ANCILLARY NOTES, Brief Assessment, Lab Results, Assessment/Plan   Last Updated: 02-Feb-14 16:22 by Loistine Simas (MD)

## 2014-12-06 NOTE — Consult Note (Signed)
Chief Complaint:   Subjective/Chief Complaint seen for hematemesis.  no recurrent emesis since admission.  no melena.  patietn denies abdominal pain or nausea.   VITAL SIGNS/ANCILLARY NOTES: **Vital Signs.:   03-Feb-14 14:44   Temperature Temperature (F) 98.2   Celsius 36.7   Temperature Source axillary   Pulse Pulse 92   Respirations Respirations 28   Systolic BP Systolic BP 355   Diastolic BP (mmHg) Diastolic BP (mmHg) 74   Mean BP 89   Pulse Ox % Pulse Ox % 96   Pulse Ox Activity Level  At rest   Oxygen Delivery 2L   Brief Assessment:   Cardiac Regular    Respiratory clear BS    Gastrointestinal details normal Soft  Nontender  Nondistended  No masses palpable  Bowel sounds normal   Lab Results: Routine Chem:  03-Feb-14 03:14    Glucose, Serum 79   BUN  24   Creatinine (comp)  1.86   Sodium, Serum  151   Potassium, Serum 3.5   Chloride, Serum  120   CO2, Serum 22   Calcium (Total), Serum  8.2   Anion Gap 9   Osmolality (calc) 303   eGFR (African American)  41   eGFR (Non-African American)  36 (eGFR values <73m/min/1.73 m2 may be an indication of chronic kidney disease (CKD). Calculated eGFR is useful in patients with stable renal function. The eGFR calculation will not be reliable in acutely ill patients when serum creatinine is changing rapidly. It is not useful in  patients on dialysis. The eGFR calculation may not be applicable to patients at the low and high extremes of body sizes, pregnant women, and vegetarians.)   Result Comment LABS - This specimen was collected through an   - indwelling catheter or arterial line.  - A minimum of 549m of blood was wasted prior    - to collecting the sample.  Interpret  - results with caution.  Result(s) reported on 18 Sep 2012 at 03:32AM.  Routine Hem:  03-Feb-14 03:14    WBC (CBC) 10.3   RBC (CBC)  3.34   Hemoglobin (CBC)  10.0   Hematocrit (CBC)  30.0   Platelet Count (CBC) 151   MCV 90   MCH 30.0   MCHC  33.4   RDW  16.0   Neutrophil % 90.6   Lymphocyte % 5.9   Monocyte % 3.4   Eosinophil % 0.0   Basophil % 0.1   Neutrophil #  9.4   Lymphocyte #  0.6   Monocyte # 0.3   Eosinophil # 0.0   Basophil # 0.0 (Result(s) reported on 18 Sep 2012 at 03:31AM.)   Assessment/Plan:  Assessment/Plan:   Assessment 1) hematemesis/coffeeground emesis-h/o gastric and uper duodenal avms.  no recurrent episodes.   2) acute respiratory failure/pneumonia-improving.    Plan 1)  continue bid ppi.  concern for possible aspiration.  patient tolerating clears.  will order bedside swallowing study before advancing diet.  2) no plans for egd.   Electronic Signatures: SkLoistine SimasMD)  (Signed 03-Feb-14 18:26)  Authored: Chief Complaint, VITAL SIGNS/ANCILLARY NOTES, Brief Assessment, Lab Results, Assessment/Plan   Last Updated: 03-Feb-14 18:26 by SkLoistine SimasMD)

## 2014-12-06 NOTE — Discharge Summary (Signed)
PATIENT NAME:  Jon Silva, Jon Silva MR#:  [id number] DATE OF BIRTH:  05-25-1941  DATE OF ADMISSION:  09/16/2012 DATE OF DISCHARGE:  09/21/2012   PRIMARY CARE PHYSICIAN: Glenetta Borg, MD  CONSULTATIONS: Pulmonary, Dr. Belia Heman. Palliative care, Dr. Harvie Junior. Surgery, Dr. Juliann Pulse. GI, Dr. Marva Panda.   DISCHARGE DIAGNOSES:   1.  Septic shock.  2.  Acute respiratory failure due to aspiration pneumonia.  3.  Urinary tract infection.  4.  Acute renal failure on chronic kidney disease. 5.  Gastrointestinal bleeding.  6.  Anemia.  7.  Chronic obstructive pulmonary disease.  8.  Hypernatremia.  9.  Hypokalemia.  10.  Peripheral vascular disease.  CODE STATUS: DO NOT RESUSCITATE.   CONDITION: Stable.   HOME MEDICATIONS:  1.  Aspirin 81 mg p.o. daily. 2.  Omeprazole 20 mg p.o. b.i.d.  3.  Cerovite Advanced Formula 1 tablet p.o. daily.  4.  Colace 100 mg p.o. 2 tablets once a day at bedtime.  5.  Risperdal 1 mg p.o. at bedtime.  6.  Vitamin D3, 50,000 international unit capsule, 1 capsule once a month.  7.  Lisinopril 2.5 mg p.o. daily.  8.  Promethazine 20 mg p.o. q.6 hours p.r.n. for nausea, vomiting.  9.  Tylenol 325 mg p.o. 2 tablets every 4 hours p.r.n.  10.  Levaquin 500 mg p.o. every 24 hours for 5 days.   DIET: Low sodium, low fat, low cholesterol, pureed thin liquid diet. Please refer to the diet instructions in [facility] discharge instructions.   ACTIVITY: As tolerated.   FOLLOWUP CARE: Follow up with PCP within 1 week. The patient needs a followup BMP.  The patient will be discharged to skilled nursing facility with hospice.   REASON FOR ADMISSION: Coffee-ground emesis.   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 The patient was lethargic and had a large episode of coffee-ground emesis in the ED.  The patient's hemoglobin was at 16.3. The patient's blood pressure was low, at lower side, which improved with IV fluid bolus. His creatinine was noted to be worsened, with baseline 1.5 to 1.9. Urinalysis showed UTI. The patient also had significant wheezing, shortness of breath, tachypnea, tachycardia. CAT scan of abdomen and pelvis showed distended stomach with bibasilar opacity due to aspiration. The patient was inserted with NGT with intermittent suction and was started on IV vancomycin and Zosyn for aspiration pneumonia. For detailed history and physical examination, please refer to the admission note dictated by Dr. Randol Kern. On admission date, the patient's glucose was 173, BUN 35, creatinine 1.9, sodium 142, potassium 4. Urinalysis showed WBC 26, 3+ bacteria.  CAT scan of abdomen and pelvis as mentioned above.  1.  For acute respiratory failure due to aspiration pneumonia, the patient was treated with vancomycin and Zosyn and nebulizer. Antibiotics were changed to Levaquin after the patient's symptoms improved.  2.  For coffee-ground vomiting, the patient was treated with Protonix drip. Dr. Marva Panda evaluated the patient and suggested no plan for EGD due to patient's status. The patient's hemoglobin decreased from 14.7 gradually to 9.1, but the patient had no active bleeding after hospitalization, which anemia is possibly due to IV fluid dilution. The patient needs to follow up with Dr. Marva Panda as outpatient.  3.  For septic shock, the patient initially was treated with IV fluid bolus, but the patient's blood pressure was still  low so the patient was admitted to CCU and started on Levophed drip. Two days after Levophed drip, the patient's blood pressure improved so Levophed was discontinued. 4.  Acute renal failure on chronic disease: The patient's lisinopril was on hold due to acute renal failure. The patient was treated with IV fluid support. BUN decreased from 47 to 9. Creatinine decreased from 2.33  to 1.55, which is the patient's baseline.  5.  Hypokalemia: The patient was treated with potassium supplement and potassium level increased to 3.4.  6.  Hypertension: The patient's blood pressure has been stable after off Levophed. Not on any hypertensive medication.  7.  For COPD, the patient has been treated with nebulizer and oxygen. The patient is still on oxygen by nasal cannula, 3 liters. Oxygen saturation is about 91. The patient may need oxygen at this time due to aspiration pneumonia and COPD.   DISPOSITION: Generally, the patient is clinically improving and stable. He will be discharged to skilled nursing facility with hospice care. Discussed the patient's discharge plan with the patient's sister, case manager and hospice staff.   TIME SPENT: About 45 minutes.    ____________________________ Shaune PollackQing Esco Joslyn, MD qc:jm D: 09/21/2012 13:48:57 ET T: 09/21/2012 14:57:25 ET JOB#: [id number]  cc: Shaune PollackQing Etan Vasudevan, MD, <Dictator> Shaune PollackQING Gilmer Kaminsky MD ELECTRONICALLY SIGNED 09/21/2012 18:44

## 2014-12-06 NOTE — Op Note (Signed)
PATIENT NAME:  Jon Silva, Jon Silva MR#:  [id number] DATE OF BIRTH:  16-Aug-1941  DATE OF PROCEDURE:  09/16/2012  PREOPERATIVE DIAGNOSIS:  1.  Shock. 2.  Hypotension. 3.  Need for central venous catheterization.   POSTOPERATIVE DIAGNOSIS:  1.  Shock. 2.  Hypotension. 3.  Need for central venous catheterization.   PROCEDURE PERFORMED:  Ultrasound-guided right internal jugular vein central venous catheter placement.   ESTIMATED BLOOD LOSS: Illness.   COMPLICATIONS: None.   SPECIMENS: None.   SURGEON: Anelisse Jacobson A. Mesha Schamberger, M.D.   ANESTHESIA: 10 mL of 1% lidocaine.   INDICATIONS FOR SURGERY:  The patient is a pleasant 74 year old male with history of hypotension and lactic acidosis. He was admitted to the ICU and was in need for central venous catheterization for better IV access as well as for CVP monitoring.  We, thus, proceeded with central venous catheter placement.   DETAILS OF PROCEDURE: Informed consent was obtained. His neck was prepped and draped in standard surgical fashion. a time-out was performed, correctly identifying the patient name, operative site and procedure to be performed. Ultrasound was then placed over his right internal jugular which was noted to be patent and was accessed with 1 stick.  A wire was placed through this.  It passed easily into the internal jugular vein.  The needle was taken out, the tract was dilated and the triple-lumen catheter was placed at approximately 19 cm.  It was sutured in place. The wire was removed. All 3 ports flushed well. A sterile dressing was then placed over the wound.  There were no immediate complications. X-ray is pending.  Needle, sponge and instrument counts were correct at the end of the procedure.    ____________________________ Si Raiderhristopher A. Odella Appelhans, MD cal:ct D: 09/16/2012 11:20:11 ET T: 09/17/2012 14:08:41 ET JOB#: [id number]  cc: Cristal Deerhristopher A. Cutberto Winfree, MD, <Dictator> Jarvis NewcomerHRISTOPHER A Deena Shaub MD ELECTRONICALLY  SIGNED 09/24/2012 9:58

## 2014-12-06 NOTE — Consult Note (Signed)
Chief Complaint:   Subjective/Chief Complaint seen for hematemesis.  No recurrent since admission.  tolerating full liquids po.  deneis abdominalpain or nausea.   VITAL SIGNS/ANCILLARY NOTES: **Vital Signs.:   04-Feb-14 13:52   Vital Signs Type Routine   Temperature Temperature (F) 97.8   Celsius 36.5   Temperature Source AdultAxillary   Pulse Pulse 100   Systolic BP Systolic BP 108   Diastolic BP (mmHg) Diastolic BP (mmHg) 63   Mean BP 78   Pulse Ox % Pulse Ox % 89   Pulse Ox Activity Level  At rest   Oxygen Delivery 2L   Pulse Ox Heart Rate 96   Brief Assessment:   Cardiac Regular    Respiratory clear BS    Gastrointestinal details normal Soft  Nontender  Nondistended  No masses palpable  Bowel sounds normal   Lab Results: Routine Chem:  31-Jan-14 23:50    BUN  35  01-Feb-14 09:30    BUN  47  02-Feb-14 02:34    BUN  34  03-Feb-14 03:14    BUN  24  04-Feb-14 05:33    Glucose, Serum 80   BUN 16   Creatinine (comp)  1.77   Sodium, Serum 145   Potassium, Serum  3.3   Chloride, Serum  115   CO2, Serum 23   Calcium (Total), Serum  8.1   Anion Gap 7   Osmolality (calc) 289   eGFR (African American)  44   eGFR (Non-African American)  38 (eGFR values <60mL/min/1.73 m2 may be an indication of chronic kidney disease (CKD). Calculated eGFR is useful in patients with stable renal function. The eGFR calculation will not be reliable in acutely ill patients when serum creatinine is changing rapidly. It is not useful in  patients on dialysis. The eGFR calculation may not be applicable to patients at the low and high extremes of body sizes, pregnant women, and vegetarians.)   Magnesium, Serum  1.7 (1.8-2.4 THERAPEUTIC RANGE: 4-7 mg/dL TOXIC: > 10 mg/dL  -----------------------)  Routine Hem:  02-Feb-14 02:34    Hemoglobin (CBC)  10.8  03-Feb-14 03:14    Hemoglobin (CBC)  10.0  04-Feb-14 05:33    WBC (CBC) 9.0   RBC (CBC)  3.19   Hemoglobin (CBC)  9.5    Hematocrit (CBC)  28.2   Platelet Count (CBC) 157   MCV 88   MCH 29.8   MCHC 33.7   RDW  16.3   Neutrophil % 88.2   Lymphocyte % 8.5   Monocyte % 3.0   Eosinophil % 0.2   Basophil % 0.1   Neutrophil #  8.0   Lymphocyte #  0.8   Monocyte # 0.3   Eosinophil # 0.0   Basophil # 0.0 (Result(s) reported on 19 Sep 2012 at 06:05AM.)   Assessment/Plan:  Assessment/Plan:   Assessment 1) hematemesis-stable, not recurrent.  On ppi.  tolerating po.  bedside swallowing test results noted, discussed with SLP.    Plan 1) will advance diet tomorrow to pureed.   2) continue on bid protonix, will change to po.  3) will check h. pylori stool antigen.  4) will sign off, reconsult if needed.   Electronic Signatures: Skulskie, Martin (MD)  (Signed 04-Feb-14 20:38)  Authored: Chief Complaint, VITAL SIGNS/ANCILLARY NOTES, Brief Assessment, Lab Results, Assessment/Plan   Last Updated: 04-Feb-14 20:38 by Skulskie, Martin (MD) 

## 2014-12-06 NOTE — Consult Note (Signed)
PATIENT NAME:  Jon Silva, [name] MR#:  [id number] DATE OF BIRTH:  07-02-1941  DATE OF CONSULTATION:  09/16/2012  REFERRING PHYSICIAN:   CONSULTING PHYSICIAN:  Cristal Deerhristopher A. Correll Denbow, MD  REASON FOR CONSULTATION: Hypotension, coffee-ground emesis, tachycardia, lactic acidosis.   HISTORY OF PRESENT ILLNESS: All the data is obtained from the patient's chart as family is not nearby and the patient is noncommunicative.  Per chart he is a 74 year old male who presents with coffee-ground emesis, multiple as well as in the ED, hypotension, hypoxia, tachycardia, hemoglobin and worsening creatinine and lactic acidosis. He is unable to tell me how long this has been going on. He is noncommunicative.  He does  report that he has no abdominal pain and when I pushed on his belly, he does not have any other issues.  He is believed to have aspiration pneumonia and is been treated for it with vancomycin and Zosyn and is on a Protonix drip as well as urinary tract infection.   PAST MEDICAL HISTORY: 1.  History of COPD with ongoing tobacco use.  2.  Episodic sinus bradycardia.  3.  Hyperlipidemia.  4. 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 5.  History of CVA with expressive aphasia and right-sided hemiparesis.  6.  Hypertension.  7.  History of gastrointestinal bleed complicated by C. dif colitis.  8.  Gastroesophageal reflux disease.  9.  Chronic kidney disease stage III with baseline creatinine about 1.8.  10.  Paranoia and psychosis.   FAMILY HISTORY: Not obtainable.   SOCIAL HISTORY: Not obtainable except he was a smoker in the past.   HOME MEDICATIONS:   1.  Aspirin 81 daily.  2.  Cerovite Advanced Formula. 3.  Colace. 4.  Lisinopril. 5.  Omeprazole. 6.  Phenergan. 7.  Risperdal. 8.  Tylenol. 9.  Vitamin D.  REVIEW OF SYSTEMS:  Unable to get an adequate review of systems.   PHYSICAL EXAMINATION: VITAL SIGNS: Temperature 99, pulse is 130, blood pressure is  85/60, respirations 99 on nonrebreather.  GENERAL: No acute distress, appears pleasant.  HEAD: Normocephalic, atraumatic.  EYES: No scleral icterus. No conjunctivitis. Face: No obvious facial trauma. Normal external nose. Normal external ears.  NECK: Supple. No obvious masses, thin.  CHEST: Lungs clear to auscultation. HEART: Tachycardic, regular rhythm.  ABDOMEN: Soft, nontender, nondistended. Does not express pain.  EXTREMITIES: Moves left extremity well.  The right extremity is contorted.  LABORATORY AND DIAGNOSTIC DATA: White cell count of 2.2, hemoglobin 14.7, hematocrit 45.1, platelets 197, creatinine of 2.3, bicarbonate of 19 and lactic acid at 7:30 this morning is 7.3.    CT scan shows a dilated proximal bowel, collapsed distal bowel, minimal intra-abdominal fluid and minimal portal venous air versus pneumobilia.   ASSESSMENT AND PLAN: The patient is a pleasant 74 year old male with acute onset of coffee ground emesis, lactic acidosis, tachycardia and hypertension.  I have placed a central line and I will speak with his family regarding his end-of-life wishes if I had to deem there to be a surgical etiology his acidosis that would require surgery. We will continue to resuscitate and check CVPs at this time.  His exam otherwise is benign. No need for emergent surgery. We will continue to follow closely.       ____________________________ Si Raiderhristopher A. Romonia Yanik, MD cal:ct D: 09/16/2012 11:26:18 ET T: 09/17/2012 07:05:20 ET JOB#: [id number]  cc: Cristal Deerhristopher A. Laniesha Das, MD, <Dictator> Jarvis NewcomerHRISTOPHER A Sender Rueb MD ELECTRONICALLY SIGNED 09/24/2012 9:58

## 2017-08-03 ENCOUNTER — Other Ambulatory Visit
Admission: RE | Admit: 2017-08-03 | Discharge: 2017-08-03 | Disposition: A | Discharge status: Home / Self Care | Payer: Medicare Other | Source: Ambulatory Visit | Attending: Family Medicine | Admitting: Family Medicine

## 2017-08-03 DIAGNOSIS — R4182 Altered mental status, unspecified: Secondary | ICD-10-CM | POA: Diagnosis present

## 2017-08-03 LAB — CBC WITH DIFFERENTIAL/PLATELET
BASOS PCT: 0 %
Basophils Absolute: 0 10*3/uL (ref 0–0.1)
EOS ABS: 0 10*3/uL (ref 0–0.7)
Eosinophils Relative: 0 %
HCT: 41.7 % (ref 40.0–52.0)
HEMOGLOBIN: 13 g/dL (ref 13.0–18.0)
LYMPHS ABS: 1 10*3/uL (ref 1.0–3.6)
Lymphocytes Relative: 5 %
MCH: 29.8 pg (ref 26.0–34.0)
MCHC: 31.1 g/dL — ABNORMAL LOW (ref 32.0–36.0)
MCV: 95.8 fL (ref 80.0–100.0)
Monocytes Absolute: 1 10*3/uL (ref 0.2–1.0)
Monocytes Relative: 5 %
NEUTROS PCT: 90 %
Neutro Abs: 17.6 10*3/uL — ABNORMAL HIGH (ref 1.4–6.5)
Platelets: 629 10*3/uL — ABNORMAL HIGH (ref 150–440)
RBC: 4.35 MIL/uL — AB (ref 4.40–5.90)
RDW: 16.5 % — ABNORMAL HIGH (ref 11.5–14.5)
WBC: 19.6 10*3/uL — AB (ref 3.8–10.6)

## 2017-08-03 LAB — COMPREHENSIVE METABOLIC PANEL
ALBUMIN: 2.7 g/dL — AB (ref 3.5–5.0)
ALK PHOS: 100 U/L (ref 38–126)
ALT: 38 U/L (ref 17–63)
AST: 57 U/L — AB (ref 15–41)
Anion gap: 26 — ABNORMAL HIGH (ref 5–15)
BUN: 126 mg/dL — ABNORMAL HIGH (ref 6–20)
CALCIUM: 9.6 mg/dL (ref 8.9–10.3)
CO2: 17 mmol/L — AB (ref 22–32)
CREATININE: 5.11 mg/dL — AB (ref 0.61–1.24)
Chloride: 123 mmol/L — ABNORMAL HIGH (ref 101–111)
GFR calc Af Amer: 12 mL/min — ABNORMAL LOW (ref >60)
GFR calc non Af Amer: 10 mL/min — ABNORMAL LOW (ref >60)
GLUCOSE: 153 mg/dL — AB (ref 65–99)
Potassium: 4 mmol/L (ref 3.5–5.1)
SODIUM: 166 mmol/L — AB (ref 135–145)
Total Bilirubin: 0.5 mg/dL (ref 0.3–1.2)
Total Protein: 8.8 g/dL — ABNORMAL HIGH (ref 6.5–8.1)

## 2017-08-16 DEATH — deceased
